# Patient Record
Sex: Female | Born: 1975 | Race: White | Hispanic: No | Marital: Single | Smoking: Never smoker
Health system: Southern US, Community
[De-identification: ages and names within clinical notes are randomized; demographics above are authoritative.]

## PROBLEM LIST (undated history)

## (undated) DIAGNOSIS — F99 Mental disorder, not otherwise specified: Secondary | ICD-10-CM

## (undated) DIAGNOSIS — Z309 Encounter for contraceptive management, unspecified: Secondary | ICD-10-CM

## (undated) DIAGNOSIS — D649 Anemia, unspecified: Secondary | ICD-10-CM

## (undated) DIAGNOSIS — I10 Essential (primary) hypertension: Secondary | ICD-10-CM

## (undated) DIAGNOSIS — E669 Obesity, unspecified: Secondary | ICD-10-CM

## (undated) HISTORY — PX: WISDOM TOOTH EXTRACTION: SHX21

## (undated) HISTORY — DX: Encounter for contraceptive management, unspecified: Z30.9

## (undated) HISTORY — PX: LASIK: SHX215

## (undated) HISTORY — DX: Obesity, unspecified: E66.9

## (undated) HISTORY — DX: Anemia, unspecified: D64.9

## (undated) HISTORY — PX: GUM SURGERY: SHX658

## (undated) HISTORY — DX: Mental disorder, not otherwise specified: F99

## (undated) HISTORY — DX: Essential (primary) hypertension: I10

## (undated) HISTORY — PX: KNEE ARTHROSCOPY: SHX127

---

## 1998-06-01 ENCOUNTER — Other Ambulatory Visit: Admission: RE | Admit: 1998-06-01 | Discharge: 1998-06-01 | Payer: Self-pay | Admitting: Obstetrics and Gynecology

## 1999-06-12 ENCOUNTER — Other Ambulatory Visit: Admission: RE | Admit: 1999-06-12 | Discharge: 1999-06-12 | Payer: Self-pay | Admitting: *Deleted

## 2000-07-03 ENCOUNTER — Other Ambulatory Visit: Admission: RE | Admit: 2000-07-03 | Discharge: 2000-07-03 | Payer: Self-pay | Admitting: *Deleted

## 2001-09-15 ENCOUNTER — Other Ambulatory Visit: Admission: RE | Admit: 2001-09-15 | Discharge: 2001-09-15 | Payer: Self-pay | Admitting: Obstetrics and Gynecology

## 2002-09-16 ENCOUNTER — Other Ambulatory Visit: Admission: RE | Admit: 2002-09-16 | Discharge: 2002-09-16 | Payer: Self-pay | Admitting: Obstetrics and Gynecology

## 2003-10-04 ENCOUNTER — Other Ambulatory Visit: Admission: RE | Admit: 2003-10-04 | Discharge: 2003-10-04 | Payer: Self-pay | Admitting: Obstetrics and Gynecology

## 2010-10-23 ENCOUNTER — Emergency Department (HOSPITAL_COMMUNITY)
Admission: EM | Admit: 2010-10-23 | Discharge: 2010-10-23 | Payer: Self-pay | Source: Home / Self Care | Admitting: Emergency Medicine

## 2010-10-24 LAB — CBC
HCT: 38.3 % (ref 36.0–46.0)
MCH: 28.2 pg (ref 26.0–34.0)
MCHC: 33.9 g/dL (ref 30.0–36.0)
MCV: 83.1 fL (ref 78.0–100.0)
Platelets: 328 10*3/uL (ref 150–400)
RDW: 14.2 % (ref 11.5–15.5)
WBC: 12.1 10*3/uL — ABNORMAL HIGH (ref 4.0–10.5)

## 2010-10-24 LAB — BASIC METABOLIC PANEL
BUN: 11 mg/dL (ref 6–23)
Calcium: 8.9 mg/dL (ref 8.4–10.5)
Creatinine, Ser: 0.8 mg/dL (ref 0.4–1.2)
GFR calc non Af Amer: >60 mL/min (ref >60)
Glucose, Bld: 107 mg/dL — ABNORMAL HIGH (ref 70–99)

## 2010-10-24 LAB — DIFFERENTIAL
Eosinophils Absolute: 0.1 10*3/uL (ref 0.0–0.7)
Eosinophils Relative: 1 % (ref 0–5)
Lymphocytes Relative: 13 % (ref 12–46)
Lymphs Abs: 1.6 10*3/uL (ref 0.7–4.0)
Monocytes Absolute: 0.9 10*3/uL (ref 0.1–1.0)

## 2011-06-11 ENCOUNTER — Other Ambulatory Visit: Payer: Self-pay | Admitting: Orthopedic Surgery

## 2011-06-11 DIAGNOSIS — M545 Low back pain: Secondary | ICD-10-CM

## 2011-06-15 ENCOUNTER — Ambulatory Visit
Admission: RE | Admit: 2011-06-15 | Discharge: 2011-06-15 | Disposition: A | Discharge status: Home / Self Care | Payer: BC Managed Care – PPO | Source: Ambulatory Visit | Attending: Orthopedic Surgery | Admitting: Orthopedic Surgery

## 2011-06-15 DIAGNOSIS — M545 Low back pain: Secondary | ICD-10-CM

## 2011-10-31 ENCOUNTER — Other Ambulatory Visit (HOSPITAL_COMMUNITY): Payer: Self-pay | Admitting: Internal Medicine

## 2011-10-31 ENCOUNTER — Ambulatory Visit (HOSPITAL_COMMUNITY)
Admission: RE | Admit: 2011-10-31 | Discharge: 2011-10-31 | Disposition: A | Discharge status: Home / Self Care | Payer: BC Managed Care – PPO | Source: Ambulatory Visit | Attending: Internal Medicine | Admitting: Internal Medicine

## 2011-10-31 DIAGNOSIS — M542 Cervicalgia: Secondary | ICD-10-CM

## 2013-09-28 ENCOUNTER — Other Ambulatory Visit: Payer: Self-pay | Admitting: Adult Health

## 2013-10-07 ENCOUNTER — Other Ambulatory Visit: Payer: Self-pay | Admitting: Adult Health

## 2013-10-22 ENCOUNTER — Other Ambulatory Visit: Payer: Self-pay | Admitting: *Deleted

## 2013-10-22 MED ORDER — LEVONORGESTREL-ETHINYL ESTRAD 0.1-20 MG-MCG PO TABS: 1.0000 | ORAL_TABLET | Freq: Every day | ORAL | Status: DC

## 2013-10-28 ENCOUNTER — Encounter (INDEPENDENT_AMBULATORY_CARE_PROVIDER_SITE_OTHER): Payer: Self-pay

## 2013-10-28 ENCOUNTER — Encounter: Payer: Self-pay | Admitting: Adult Health

## 2013-10-28 ENCOUNTER — Ambulatory Visit (INDEPENDENT_AMBULATORY_CARE_PROVIDER_SITE_OTHER): Payer: 59 | Admitting: Adult Health

## 2013-10-28 ENCOUNTER — Other Ambulatory Visit (HOSPITAL_COMMUNITY)
Admission: RE | Admit: 2013-10-28 | Discharge: 2013-10-28 | Disposition: A | Discharge status: Home / Self Care | Payer: 59 | Source: Ambulatory Visit | Attending: Adult Health | Admitting: Adult Health

## 2013-10-28 VITALS — BP 150/100 | HR 82 | Ht 61.0 in | Wt 198.0 lb

## 2013-10-28 DIAGNOSIS — Z01419 Encounter for gynecological examination (general) (routine) without abnormal findings: Secondary | ICD-10-CM

## 2013-10-28 DIAGNOSIS — I10 Essential (primary) hypertension: Secondary | ICD-10-CM | POA: Insufficient documentation

## 2013-10-28 DIAGNOSIS — Z309 Encounter for contraceptive management, unspecified: Secondary | ICD-10-CM

## 2013-10-28 DIAGNOSIS — Z1151 Encounter for screening for human papillomavirus (HPV): Secondary | ICD-10-CM | POA: Insufficient documentation

## 2013-10-28 HISTORY — DX: Encounter for contraceptive management, unspecified: Z30.9

## 2013-10-28 MED ORDER — LEVONORGESTREL-ETHINYL ESTRAD 0.1-20 MG-MCG PO TABS: 1.0000 | ORAL_TABLET | Freq: Every day | ORAL | Status: DC

## 2013-10-28 NOTE — Patient Instructions (Signed)
Physical in 1 year Mammogram at 40 Labs at PCP

## 2013-10-28 NOTE — Progress Notes (Signed)
Patient ID: Mckenzie Andrews, female   DOB: 18-Dec-1975, 38 y.o.   MRN: [id number] History of Present Illness: Mckenzie Andrews is a 38 year old white female, single in for a pap and physical.Happy with OCs,  Current Medications, Allergies, Past Medical History, Past Surgical History, Family History and Social History were reviewed in [facility] electronic medical record.   Past Medical History  Diagnosis Date  . Hypertension   . Obesity   . Mental disorder     anxiety  . Contraceptive management 10/28/2013   Past Surgical History  Procedure Laterality Date  . Knee arthroscopy    . Wisdom tooth extraction    Current outpatient prescriptions:hydrochlorothiazide (HYDRODIURIL) 25 MG tablet, Take 25 mg by mouth daily., Disp: , Rfl: ;  levonorgestrel-ethinyl estradiol (AVIANE) 0.1-20 MG-MCG tablet, Take 1 tablet by mouth daily., Disp: 1 Package, Rfl: 11;  NIFEdipine (PROCARDIA XL/ADALAT-CC) 60 MG 24 hr tablet, Take 60 mg by mouth daily., Disp: , Rfl: ;  sertraline (ZOLOFT) 50 MG tablet, Take 50 mg by mouth daily., Disp: , Rfl:   Review of Systems: Patient denies any blurred vision, shortness of breath, chest pain, abdominal pain, problems with bowel movements, urination, or intercourse, not currently having sex.No joint pain, takes zoloft for anxiety, has headaches but uses advil with relief.    Physical Exam:BP 150/100  Pulse 82  Ht 5\' 1"  (1.549 m)  Wt 198 lb (89.812 kg)  BMI 37.43 kg/m2  LMP 01/22/2015Has not taken BP meds today General:  Well developed, well nourished, no acute distress Skin:  Warm and dry, has tattoos Neck:  Midline trachea, normal thyroid Lungs; Clear to auscultation bilaterally Breast:  No dominant palpable mass, retraction, or nipple discharge Cardiovascular: Regular rate and rhythm Abdomen:  Soft, non tender, no hepatosplenomegaly Pelvic:  External genitalia is normal in appearance.  The vagina is normal in appearance. The cervix is smooth and friable with EC brush,  pap with HPV performed.  Uterus is felt to be normal size, shape, and contour.  No  adnexal masses or tenderness noted. Extremities:  No swelling or varicosities noted Psych:  Alert and cooperative, seems happy, works at [employer]   Impression: Yearly gyn exam  Contraceptive management Hypertension     Plan: Physical in 1 year Mammogram at 3940  Labs with PCP Refilled aviane x 1 year

## 2014-11-04 ENCOUNTER — Other Ambulatory Visit: Payer: Self-pay | Admitting: Adult Health

## 2015-10-09 ENCOUNTER — Other Ambulatory Visit: Payer: Self-pay | Admitting: Adult Health

## 2015-12-26 ENCOUNTER — Other Ambulatory Visit: Payer: Self-pay | Admitting: Adult Health

## 2016-01-04 ENCOUNTER — Encounter: Payer: Self-pay | Admitting: Adult Health

## 2016-01-04 ENCOUNTER — Ambulatory Visit (INDEPENDENT_AMBULATORY_CARE_PROVIDER_SITE_OTHER): Payer: BLUE CROSS/BLUE SHIELD | Admitting: Adult Health

## 2016-01-04 VITALS — BP 120/80 | HR 82 | Ht 61.5 in | Wt 192.0 lb

## 2016-01-04 DIAGNOSIS — Z3041 Encounter for surveillance of contraceptive pills: Secondary | ICD-10-CM

## 2016-01-04 DIAGNOSIS — Z01419 Encounter for gynecological examination (general) (routine) without abnormal findings: Secondary | ICD-10-CM | POA: Diagnosis not present

## 2016-01-04 MED ORDER — LEVONORGESTREL-ETHINYL ESTRAD 0.1-20 MG-MCG PO TABS: ORAL_TABLET | ORAL | Status: DC

## 2016-01-04 NOTE — Patient Instructions (Signed)
Pap and physical in 1 year Mammogram at 5440  Labs with PCP

## 2016-01-04 NOTE — Progress Notes (Signed)
Patient ID: Mckenzie Andrews, female   DOB: 1976/09/17, 40 y.o.   MRN: [id number] History of Present Illness: Mckenzie KaufmannMelissa is a 40 year old white female, G0P0, single in for a well woman gyn exam,she had a normal pap with negative HPV 10/28/13.No complaints.She is happy with her OCs, she has had labs with Dr Ouida SillsFagan recently and was anemic,she says. PCP is Dr Ouida SillsFagan.   Current Medications, Allergies, Past Medical History, Past Surgical History, Family History and Social History were reviewed in [facility] electronic medical record.     Review of Systems: Patient denies any headaches, hearing loss, fatigue, blurred vision, shortness of breath, chest pain, abdominal pain, problems with bowel movements, urination, or intercourse(not currently having sex). No joint pain or mood swings.    Physical Exam:BP 120/80 mmHg  Pulse 82  Ht 5' 1.5" (1.562 m)  Wt 192 lb (87.091 kg)  BMI 35.70 kg/m2  LMP 12/10/2015 General:  Well developed, well nourished, no acute distress Skin:  Warm and dry,has several tattoos Neck:  Midline trachea, normal thyroid, good ROM, no lymphadenopathy Lungs; Clear to auscultation bilaterally Breast:  No dominant palpable mass, retraction, or nipple discharge Cardiovascular: Regular rate and rhythm Abdomen:  Soft, non tender, no hepatosplenomegaly Pelvic:  External genitalia is normal in appearance, no lesions.  The vagina is normal in appearance. Urethra has no lesions or masses. The cervix is smooth.  Uterus is felt to be normal size, shape, and contour.  No adnexal masses or tenderness noted.Bladder is non tender, no masses felt. Extremities/musculoskeletal:  No swelling or varicosities noted, no clubbing or cyanosis Psych:  No mood changes, alert and cooperative,seems happy   Impression: Well woman gyn exam no pap Contraceptive management    Plan: Pap and physical in 1 year Mammogram at 40 Refilled Falmina x 1 year Labs with PCP

## 2016-05-24 ENCOUNTER — Other Ambulatory Visit: Payer: Self-pay | Admitting: Adult Health

## 2017-05-05 ENCOUNTER — Other Ambulatory Visit: Payer: Self-pay | Admitting: Adult Health

## 2019-02-25 ENCOUNTER — Other Ambulatory Visit (HOSPITAL_COMMUNITY): Payer: Self-pay | Admitting: Internal Medicine

## 2019-02-25 DIAGNOSIS — Z1231 Encounter for screening mammogram for malignant neoplasm of breast: Secondary | ICD-10-CM

## 2019-03-17 ENCOUNTER — Ambulatory Visit (HOSPITAL_COMMUNITY)
Admission: RE | Admit: 2019-03-17 | Discharge: 2019-03-17 | Disposition: A | Discharge status: Home / Self Care | Payer: BC Managed Care – PPO | Source: Ambulatory Visit | Attending: Internal Medicine | Admitting: Internal Medicine

## 2019-03-17 DIAGNOSIS — Z1231 Encounter for screening mammogram for malignant neoplasm of breast: Secondary | ICD-10-CM | POA: Insufficient documentation

## 2019-03-18 ENCOUNTER — Other Ambulatory Visit (HOSPITAL_COMMUNITY): Payer: Self-pay | Admitting: Internal Medicine

## 2019-03-18 DIAGNOSIS — R928 Other abnormal and inconclusive findings on diagnostic imaging of breast: Secondary | ICD-10-CM

## 2019-03-24 ENCOUNTER — Ambulatory Visit (HOSPITAL_COMMUNITY): Payer: Self-pay

## 2019-03-30 ENCOUNTER — Other Ambulatory Visit: Payer: Self-pay

## 2019-03-30 ENCOUNTER — Ambulatory Visit (HOSPITAL_COMMUNITY)
Admission: RE | Admit: 2019-03-30 | Discharge: 2019-03-30 | Disposition: A | Discharge status: Home / Self Care | Payer: BC Managed Care – PPO | Source: Ambulatory Visit | Attending: Internal Medicine | Admitting: Internal Medicine

## 2019-03-30 DIAGNOSIS — R928 Other abnormal and inconclusive findings on diagnostic imaging of breast: Secondary | ICD-10-CM

## 2020-02-02 ENCOUNTER — Encounter: Payer: Self-pay | Admitting: Adult Health

## 2020-02-02 ENCOUNTER — Ambulatory Visit (INDEPENDENT_AMBULATORY_CARE_PROVIDER_SITE_OTHER): Payer: 59 | Admitting: Adult Health

## 2020-02-02 ENCOUNTER — Other Ambulatory Visit: Payer: Self-pay

## 2020-02-02 ENCOUNTER — Other Ambulatory Visit (HOSPITAL_COMMUNITY)
Admission: RE | Admit: 2020-02-02 | Discharge: 2020-02-02 | Disposition: A | Discharge status: Home / Self Care | Payer: 59 | Source: Ambulatory Visit | Attending: Adult Health | Admitting: Adult Health

## 2020-02-02 VITALS — BP 116/77 | HR 80 | Ht 61.5 in | Wt 185.0 lb

## 2020-02-02 DIAGNOSIS — Z1212 Encounter for screening for malignant neoplasm of rectum: Secondary | ICD-10-CM | POA: Diagnosis not present

## 2020-02-02 DIAGNOSIS — Z01419 Encounter for gynecological examination (general) (routine) without abnormal findings: Secondary | ICD-10-CM | POA: Diagnosis present

## 2020-02-02 DIAGNOSIS — Z1272 Encounter for screening for malignant neoplasm of vagina: Secondary | ICD-10-CM

## 2020-02-02 DIAGNOSIS — N926 Irregular menstruation, unspecified: Secondary | ICD-10-CM | POA: Diagnosis not present

## 2020-02-02 DIAGNOSIS — Z7689 Persons encountering health services in other specified circumstances: Secondary | ICD-10-CM

## 2020-02-02 DIAGNOSIS — I10 Essential (primary) hypertension: Secondary | ICD-10-CM

## 2020-02-02 DIAGNOSIS — Z1211 Encounter for screening for malignant neoplasm of colon: Secondary | ICD-10-CM

## 2020-02-02 LAB — HEMOCCULT GUIAC POC 1CARD (OFFICE): Fecal Occult Blood, POC: NEGATIVE

## 2020-02-02 MED ORDER — NORETHINDRONE 0.35 MG PO TABS: 1.0000 | ORAL_TABLET | Freq: Every day | ORAL | 11 refills | Status: DC

## 2020-02-02 NOTE — Addendum Note (Signed)
Addended by: Lynnell Dike on: 02/02/2020 02:15 PM   Modules accepted: Orders

## 2020-02-02 NOTE — Progress Notes (Signed)
Patient ID: Mckenzie Andrews, female   DOB: 1975/11/13, 44 y.o.   MRN: [id number] History of Present Illness:  Mckenzie Andrews is a 44 year old white female,single, G0P0, in for a well woman gyn exam and pap. Has been off OCs and periods irregular and gets stomach pain and headaches with period and body aches. PCP is Dr Ouida Sills.   Current Medications, Allergies, Past Medical History, Past Surgical History, Family History and Social History were reviewed in [employer] [facility] record.     Review of Systems: Patient denies any  hearing loss, fatigue, blurred vision, shortness of breath, chest pain, abdominal pain, problems with bowel movements, urination, or intercourse(not active). No joint pain or mood swings. Periods irregular and heavy 1-2 days, stomach hurts and has headaches then too, along with body aches.    Physical Exam:BP 116/77 (BP Location: Right Arm, Patient Position: Sitting, Cuff Size: Normal)   Pulse 80   Ht 5' 1.5" (1.562 m)   Wt 185 lb (83.9 kg)   LMP 01/28/2020 (Exact Date)   BMI 34.39 kg/m  General:  Well developed, well nourished, no acute distress Skin:  Warm and dry Neck:  Midline trachea, normal thyroid, good ROM, no lymphadenopathy Lungs; Clear to auscultation bilaterally Breast:  No dominant palpable mass, retraction, or nipple discharge Cardiovascular: Regular rate and rhythm Abdomen:  Soft, non tender, no hepatosplenomegaly Pelvic:  External genitalia is normal in appearance, no lesions.  The vagina is normal in appearance. Urethra has no lesions or masses. The cervix is smooth and nulliparous, pap with high risk HPV 16/18 genotyping performed.  Uterus is felt to be normal size, shape, and contour.  No adnexal masses or tenderness noted.Bladder is non tender, no masses felt. Rectal: Good sphincter tone, no polyps, or hemorrhoids felt.  Hemoccult negative. Extremities/musculoskeletal:  No swelling or varicosities noted, no clubbing or cyanosis Psych:  No  mood changes, alert and cooperative,seems happy AA 7  Fall risk is low PHQ 9 score is 2, no SI is on Zoloft Examination chaperoned by Stoney Bang LPN   Impression: 1. Encounter for gynecological examination with Papanicolaou smear of cervix Pap sent Physical in 1 year Pap in 3 if normal Mammogram yearly Check CBC,CMP,TSH and lipids  2. Screening for colorectal cancer Colonoscopy at 50  3. Menstrual periods irregular Will try Micronor   4. Encounter for menstrual regulation Will Rx Micronor, start with next period Meds ordered this encounter  Medications  . norethindrone (MICRONOR) 0.35 MG tablet    Sig: Take 1 tablet (0.35 mg total) by mouth daily.    Dispense:  1 Package    Refill:  11    Order Specific Question:   Supervising Provider    Answer:   Despina Hidden, LUTHER H [[id number]]    5. Hypertension, unspecified type On meds from PCP

## 2020-02-03 ENCOUNTER — Telehealth: Payer: Self-pay | Admitting: Adult Health

## 2020-02-03 LAB — COMPREHENSIVE METABOLIC PANEL
ALT: 20 IU/L (ref 0–32)
AST: 23 IU/L (ref 0–40)
Albumin/Globulin Ratio: 1.3 (ref 1.2–2.2)
Albumin: 4.1 g/dL (ref 3.8–4.8)
Alkaline Phosphatase: 86 IU/L (ref 39–117)
BUN/Creatinine Ratio: 13 (ref 9–23)
BUN: 9 mg/dL (ref 6–24)
Bilirubin Total: 0.3 mg/dL (ref 0.0–1.2)
CO2: 24 mmol/L (ref 20–29)
Calcium: 8.9 mg/dL (ref 8.7–10.2)
Chloride: 100 mmol/L (ref 96–106)
Creatinine, Ser: 0.72 mg/dL (ref 0.57–1.00)
GFR calc Af Amer: 119 mL/min/{1.73_m2} (ref >59)
GFR calc non Af Amer: 103 mL/min/{1.73_m2} (ref >59)
Globulin, Total: 3.2 g/dL (ref 1.5–4.5)
Glucose: 89 mg/dL (ref 65–99)
Potassium: 4.1 mmol/L (ref 3.5–5.2)
Sodium: 138 mmol/L (ref 134–144)
Total Protein: 7.3 g/dL (ref 6.0–8.5)

## 2020-02-03 LAB — CBC
Hematocrit: 33 % — ABNORMAL LOW (ref 34.0–46.6)
Hemoglobin: 10 g/dL — ABNORMAL LOW (ref 11.1–15.9)
MCH: 21.6 pg — ABNORMAL LOW (ref 26.6–33.0)
MCHC: 30.3 g/dL — ABNORMAL LOW (ref 31.5–35.7)
MCV: 71 fL — ABNORMAL LOW (ref 79–97)
Platelets: 397 10*3/uL (ref 150–450)
RBC: 4.62 x10E6/uL (ref 3.77–5.28)
RDW: 16.1 % — ABNORMAL HIGH (ref 11.7–15.4)
WBC: 7 10*3/uL (ref 3.4–10.8)

## 2020-02-03 LAB — LIPID PANEL
Chol/HDL Ratio: 4 ratio (ref 0.0–4.4)
Cholesterol, Total: 195 mg/dL (ref 100–199)
HDL: 49 mg/dL (ref >39)
LDL Chol Calc (NIH): 114 mg/dL — ABNORMAL HIGH (ref 0–99)
Triglycerides: 185 mg/dL — ABNORMAL HIGH (ref 0–149)
VLDL Cholesterol Cal: 32 mg/dL (ref 5–40)

## 2020-02-03 LAB — TSH: TSH: 0.994 u[IU]/mL (ref 0.450–4.500)

## 2020-02-03 NOTE — Telephone Encounter (Signed)
Left message that HGB is 10 so anemic, take OTC iron, get Flintstones complete 2 daily will be good, triglycerides and LDL slightly elevated, but other labs look good, and start Micronor. Will recheck hemoglobin in 3 months

## 2020-02-04 ENCOUNTER — Other Ambulatory Visit: Payer: BC Managed Care – PPO | Admitting: Adult Health

## 2020-02-08 LAB — CYTOLOGY - PAP
Comment: NEGATIVE
Diagnosis: NEGATIVE
High risk HPV: NEGATIVE

## 2020-03-01 ENCOUNTER — Other Ambulatory Visit: Payer: BC Managed Care – PPO | Admitting: Adult Health

## 2020-03-13 ENCOUNTER — Other Ambulatory Visit (HOSPITAL_COMMUNITY): Payer: Self-pay | Admitting: Internal Medicine

## 2020-03-13 ENCOUNTER — Telehealth: Payer: Self-pay | Admitting: Adult Health

## 2020-03-13 DIAGNOSIS — Z1231 Encounter for screening mammogram for malignant neoplasm of breast: Secondary | ICD-10-CM

## 2020-03-13 MED ORDER — NORETHINDRONE ACET-ETHINYL EST 1-20 MG-MCG PO TABS: 1.0000 | ORAL_TABLET | Freq: Every day | ORAL | 11 refills | Status: DC

## 2020-03-13 NOTE — Telephone Encounter (Signed)
Stephnie called is bleeding on Micronor, will change to junel 1/20

## 2020-04-12 ENCOUNTER — Other Ambulatory Visit: Payer: Self-pay

## 2020-04-12 ENCOUNTER — Ambulatory Visit (HOSPITAL_COMMUNITY)
Admission: RE | Admit: 2020-04-12 | Discharge: 2020-04-12 | Disposition: A | Discharge status: Home / Self Care | Payer: 59 | Source: Ambulatory Visit | Attending: Internal Medicine | Admitting: Internal Medicine

## 2020-04-12 DIAGNOSIS — Z1231 Encounter for screening mammogram for malignant neoplasm of breast: Secondary | ICD-10-CM | POA: Diagnosis not present

## 2020-05-04 ENCOUNTER — Ambulatory Visit: Payer: 59 | Admitting: Adult Health

## 2021-03-23 ENCOUNTER — Other Ambulatory Visit: Payer: Self-pay | Admitting: Adult Health

## 2021-12-07 ENCOUNTER — Other Ambulatory Visit (HOSPITAL_COMMUNITY): Payer: Self-pay | Admitting: Internal Medicine

## 2021-12-07 DIAGNOSIS — Z1231 Encounter for screening mammogram for malignant neoplasm of breast: Secondary | ICD-10-CM

## 2021-12-24 ENCOUNTER — Ambulatory Visit (HOSPITAL_COMMUNITY)
Admission: RE | Admit: 2021-12-24 | Discharge: 2021-12-24 | Disposition: A | Discharge status: Home / Self Care | Payer: 59 | Source: Ambulatory Visit | Attending: Internal Medicine | Admitting: Internal Medicine

## 2021-12-24 ENCOUNTER — Other Ambulatory Visit: Payer: Self-pay

## 2021-12-24 DIAGNOSIS — Z1231 Encounter for screening mammogram for malignant neoplasm of breast: Secondary | ICD-10-CM | POA: Diagnosis not present

## 2022-03-04 ENCOUNTER — Other Ambulatory Visit: Payer: Self-pay | Admitting: Adult Health

## 2022-04-05 ENCOUNTER — Ambulatory Visit: Payer: 59 | Admitting: Adult Health

## 2022-05-08 ENCOUNTER — Ambulatory Visit (INDEPENDENT_AMBULATORY_CARE_PROVIDER_SITE_OTHER): Payer: 59 | Admitting: Adult Health

## 2022-05-08 ENCOUNTER — Encounter: Payer: Self-pay | Admitting: Adult Health

## 2022-05-08 VITALS — BP 91/59 | HR 103 | Ht 61.5 in | Wt 188.0 lb

## 2022-05-08 DIAGNOSIS — R195 Other fecal abnormalities: Secondary | ICD-10-CM | POA: Diagnosis not present

## 2022-05-08 DIAGNOSIS — Z13220 Encounter for screening for lipoid disorders: Secondary | ICD-10-CM

## 2022-05-08 DIAGNOSIS — Z1211 Encounter for screening for malignant neoplasm of colon: Secondary | ICD-10-CM | POA: Diagnosis not present

## 2022-05-08 DIAGNOSIS — Z3041 Encounter for surveillance of contraceptive pills: Secondary | ICD-10-CM | POA: Diagnosis not present

## 2022-05-08 DIAGNOSIS — Z01419 Encounter for gynecological examination (general) (routine) without abnormal findings: Secondary | ICD-10-CM | POA: Diagnosis not present

## 2022-05-08 DIAGNOSIS — Z862 Personal history of diseases of the blood and blood-forming organs and certain disorders involving the immune mechanism: Secondary | ICD-10-CM | POA: Diagnosis not present

## 2022-05-08 LAB — HEMOCCULT GUIAC POC 1CARD (OFFICE): Fecal Occult Blood, POC: POSITIVE — AB

## 2022-05-08 MED ORDER — LEVONORGESTREL-ETHINYL ESTRAD 0.1-20 MG-MCG PO TABS: ORAL_TABLET | ORAL | 12 refills | Status: DC

## 2022-05-08 NOTE — Progress Notes (Signed)
Patient ID: Mckenzie Andrews, female   DOB: 1976/02/05, 46 y.o.   MRN: [id number] History of Present Illness: Mckenzie Andrews is a 46 year old white female,single, G0P0, in for a well woman gyn exam.  Lab Results  Component Value Date   DIAGPAP  02/02/2020    - Negative for intraepithelial lesion or malignancy (NILM)   HPVHIGH Negative 02/02/2020   PCP is Dr Ouida Sills.  Current Medications, Allergies, Past Medical History, Past Surgical History, Family History and Social History were reviewed in [employer] [facility] record.     Review of Systems: Patient denies any headaches, hearing loss, fatigue, blurred vision, shortness of breath, chest pain, abdominal pain, problems with bowel movements, urination, or intercourse(not active). No joint pain or mood swings.     Physical Exam:BP (!) 91/59 (BP Location: Right Arm, Patient Position: Sitting, Cuff Size: Normal)   Pulse (!) 103   Ht 5' 1.5" (1.562 m)   Wt 188 lb (85.3 kg)   LMP 04/05/2022   BMI 34.95 kg/m   General:  Well developed, well nourished, no acute distress Skin:  Warm and dry Neck:  Midline trachea, normal thyroid, good ROM, no lymphadenopathy Lungs; Clear to auscultation bilaterally Breast:  No dominant palpable mass, retraction, or nipple discharge Cardiovascular: Regular rate and rhythm Abdomen:  Soft, non tender, no hepatosplenomegaly Pelvic:  External genitalia is normal in appearance, has several angiokeratomas left labia.  The vagina is normal in appearance. Urethra has no lesions or masses. The cervix is nulliparous.   Uterus is felt to be normal size, shape, and contour.  No adnexal masses or tenderness noted.Bladder is non tender, no masses felt. Rectal: Good sphincter tone, no polyps, or hemorrhoids felt.  Hemoccult positive Extremities/musculoskeletal:  No swelling or varicosities noted, no clubbing or cyanosis Psych:  No mood changes, alert and cooperative,seems happy AA is 9 Fall risk is low    05/08/2022     2:27 PM 02/02/2020    1:40 PM  Depression screen PHQ 2/9  Decreased Interest 1 0  Down, Depressed, Hopeless 0 0  PHQ - 2 Score 1 0  Altered sleeping 0 1  Tired, decreased energy 1 1  Change in appetite 0 0  Feeling bad or failure about yourself  0 0  Trouble concentrating 0 0  Moving slowly or fidgety/restless 0 0  Suicidal thoughts 0 0  PHQ-9 Score 2 2  Difficult doing work/chores  Not difficult at all       05/08/2022    2:27 PM 02/02/2020    1:40 PM  GAD 7 : Generalized Anxiety Score  Nervous, Anxious, on Edge 2 0  Control/stop worrying 0 0  Worry too much - different things 0 0  Trouble relaxing 1 0  Restless 0 0  Easily annoyed or irritable 1 1  Afraid - awful might happen 0 0  Total GAD 7 Score 4 1  Anxiety Difficulty  Not difficult at all    Upstream - 05/08/22 1428       Pregnancy Intention Screening   Does the patient want to become pregnant in the next year? No    Does the patient's partner want to become pregnant in the next year? No    Would the patient like to discuss contraceptive options today? No      Contraception Wrap Up   Current Method Oral Contraceptive    End Method Oral Contraceptive    Contraception Counseling Provided No  Examination chaperoned by Faith Rogue LPN    Impression and Plan: 1. Encounter for well woman exam with routine gynecological exam Physical in 1 year Check labs Had normal mammogram 12/24/21. - CBC - Comprehensive metabolic panel - TSH - Lipid panel  2. Encounter for screening fecal occult blood testing +hemoccult  - POCT occult blood stool  3. Encounter for surveillance of contraceptive pills Will continue OCs Meds ordered this encounter  Medications   levonorgestrel-ethinyl estradiol (FALMINA) 0.1-20 MG-MCG tablet    Sig: TAKE ONE (1) TABLET BY MOUTH EVERY DAY    Dispense:  28 tablet    Refill:  12    Tell [name] to make appt to be seen, please    Order Specific Question:   Supervising  Provider    Answer:   Despina Hidden, LUTHER H [[id number]]     4. Fecal occult blood test positive +hemoccult, will send 3 cards home with her to do, if any + refer to GI for colonoscopy  - POCT occult blood stool  5. History of anemia - CBC  6. Screening cholesterol level Check fasting  - Lipid panel

## 2022-05-24 ENCOUNTER — Other Ambulatory Visit (INDEPENDENT_AMBULATORY_CARE_PROVIDER_SITE_OTHER): Payer: 59

## 2022-05-24 ENCOUNTER — Telehealth: Payer: Self-pay | Admitting: *Deleted

## 2022-05-24 DIAGNOSIS — Z1212 Encounter for screening for malignant neoplasm of rectum: Secondary | ICD-10-CM

## 2022-05-24 DIAGNOSIS — Z1211 Encounter for screening for malignant neoplasm of colon: Secondary | ICD-10-CM

## 2022-05-24 LAB — HEMOCCULT GUIAC POC 1CARD (OFFICE)
Card #2 Fecal Occult Blod, POC: NEGATIVE
Card #3 Fecal Occult Blood, POC: NEGATIVE
Fecal Occult Blood, POC: NEGATIVE

## 2022-05-24 NOTE — Telephone Encounter (Signed)
Pt aware hemocult cards were negative. Pt voiced understanding. JSY 

## 2022-06-19 IMAGING — MG MM DIGITAL SCREENING BILAT W/ TOMO AND CAD
6 of 10 series · 6 of 30 positions shown · non-contrast
Comparison: Previous exam(s).

CLINICAL DATA: Screening.

EXAM:
DIGITAL SCREENING BILATERAL MAMMOGRAM WITH TOMOSYNTHESIS AND CAD
TECHNIQUE: Bilateral screening digital craniocaudal and mediolateral oblique
mammograms were obtained. Bilateral screening digital breast
tomosynthesis was performed. The images were evaluated with
computer-aided detection.

[L CC synth-2D]
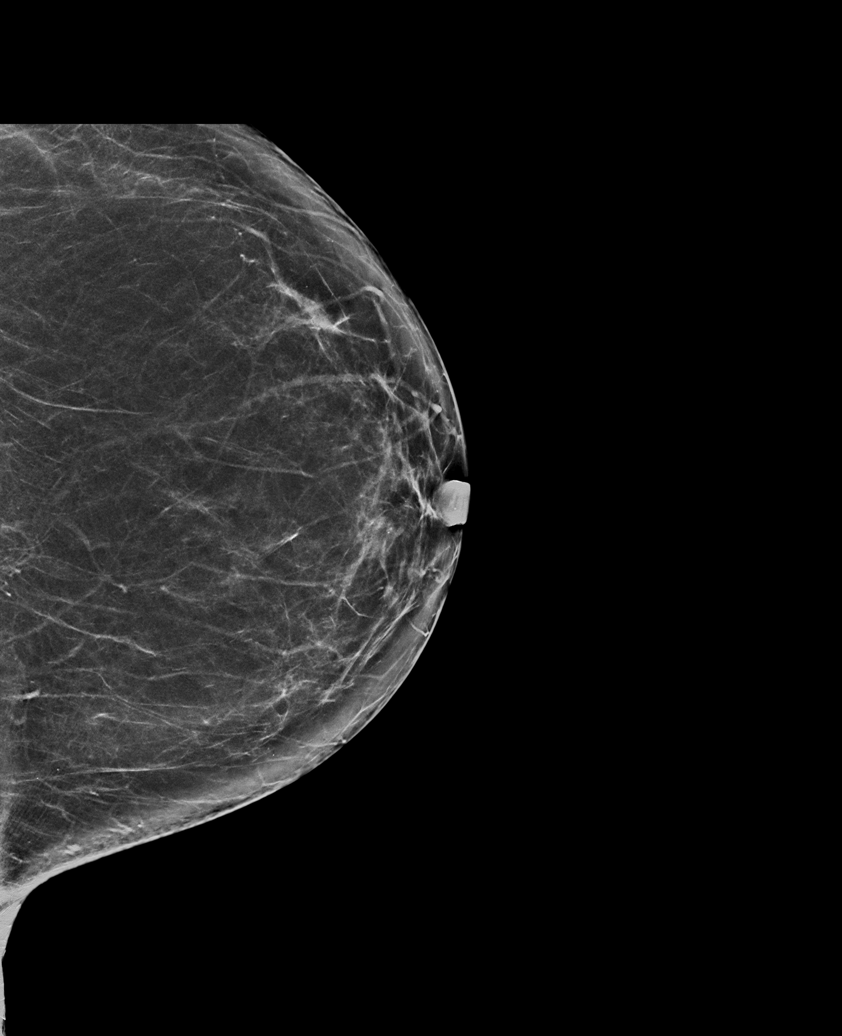

[L XCCL synth-2D]
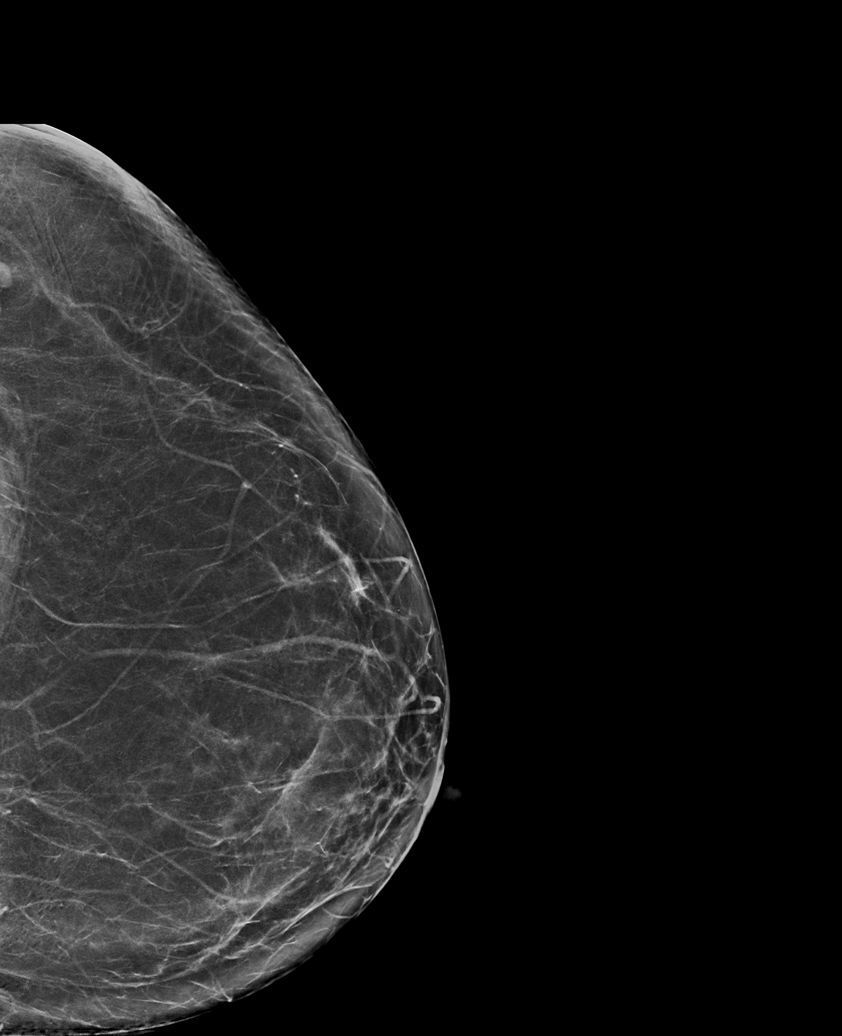

[R MLO synth-2D]
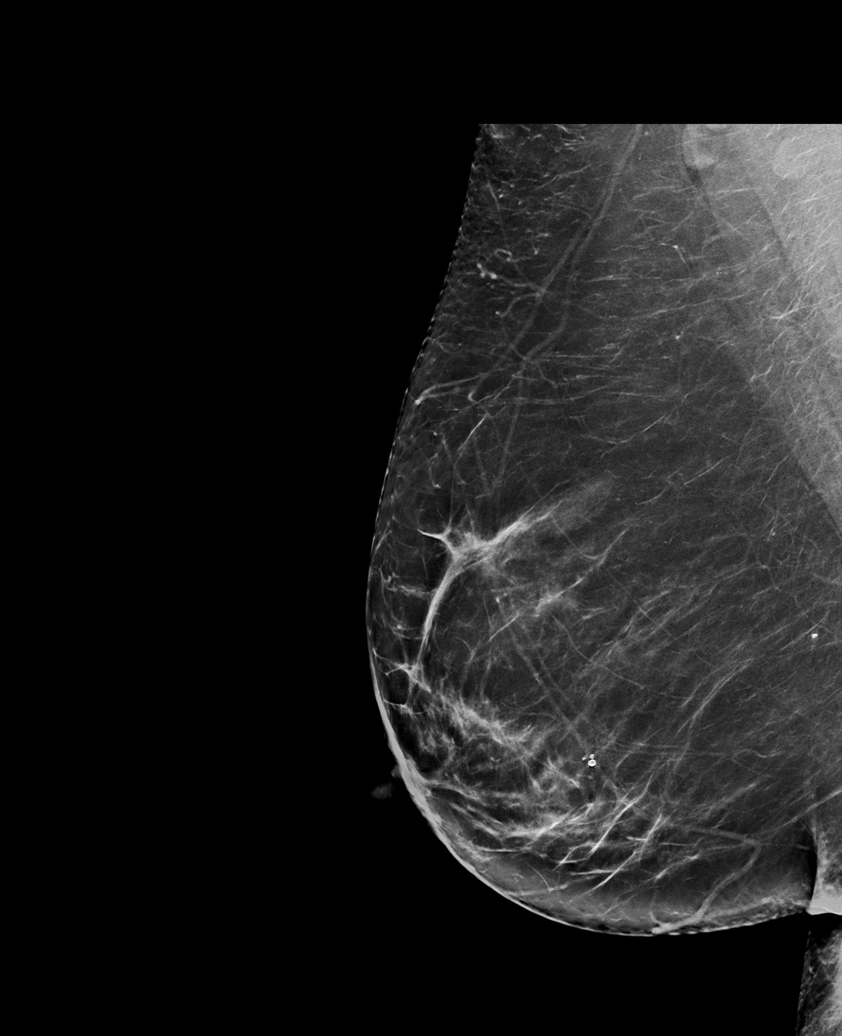

[L MLO synth-2D]
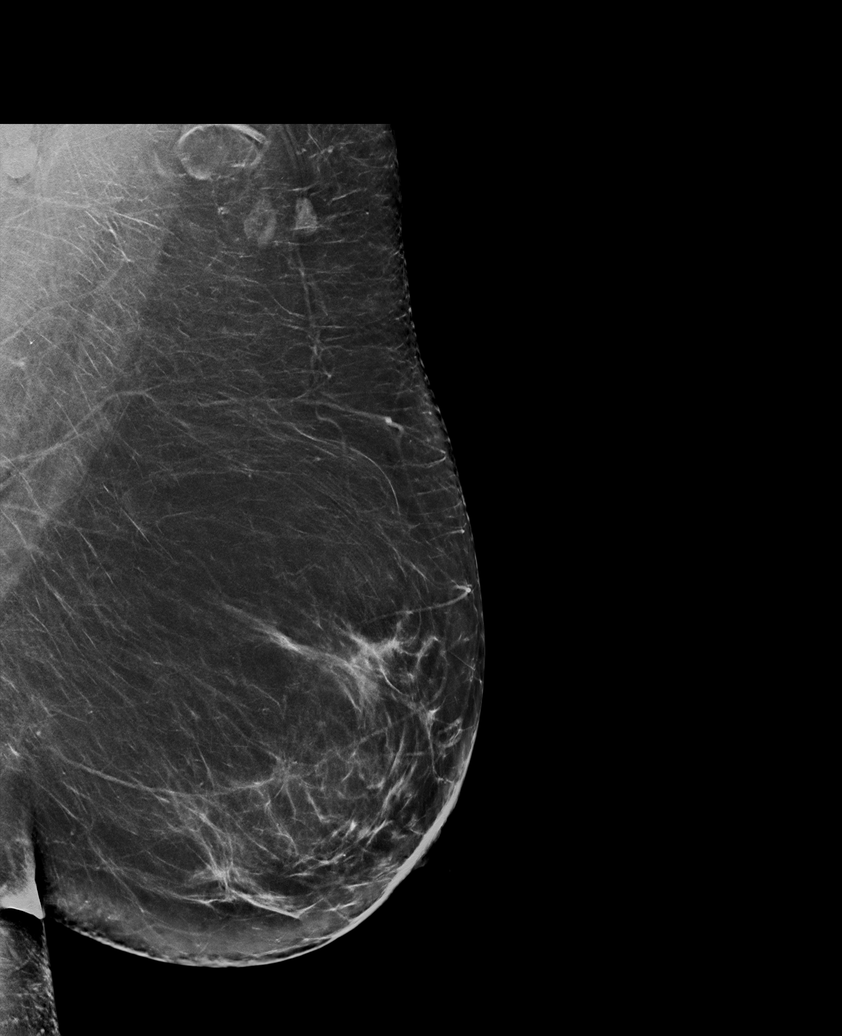

[R CC synth-2D]
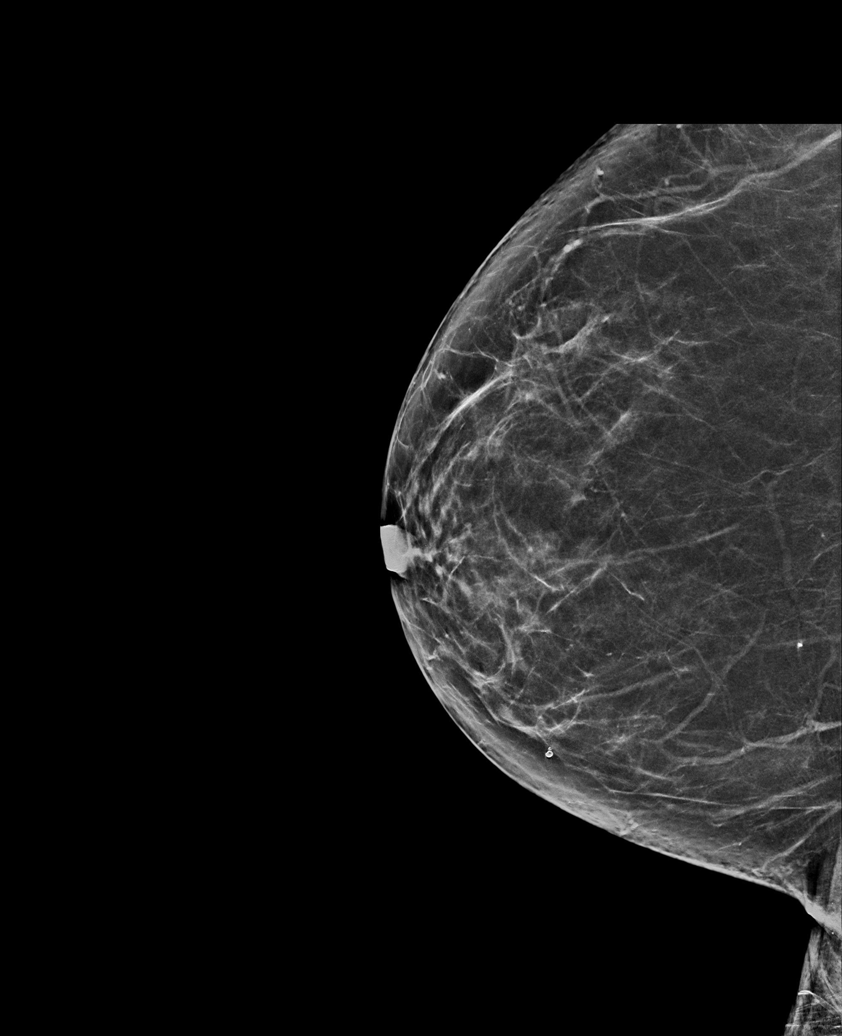

[L MLO tomo · tomo slice 45/89.0]
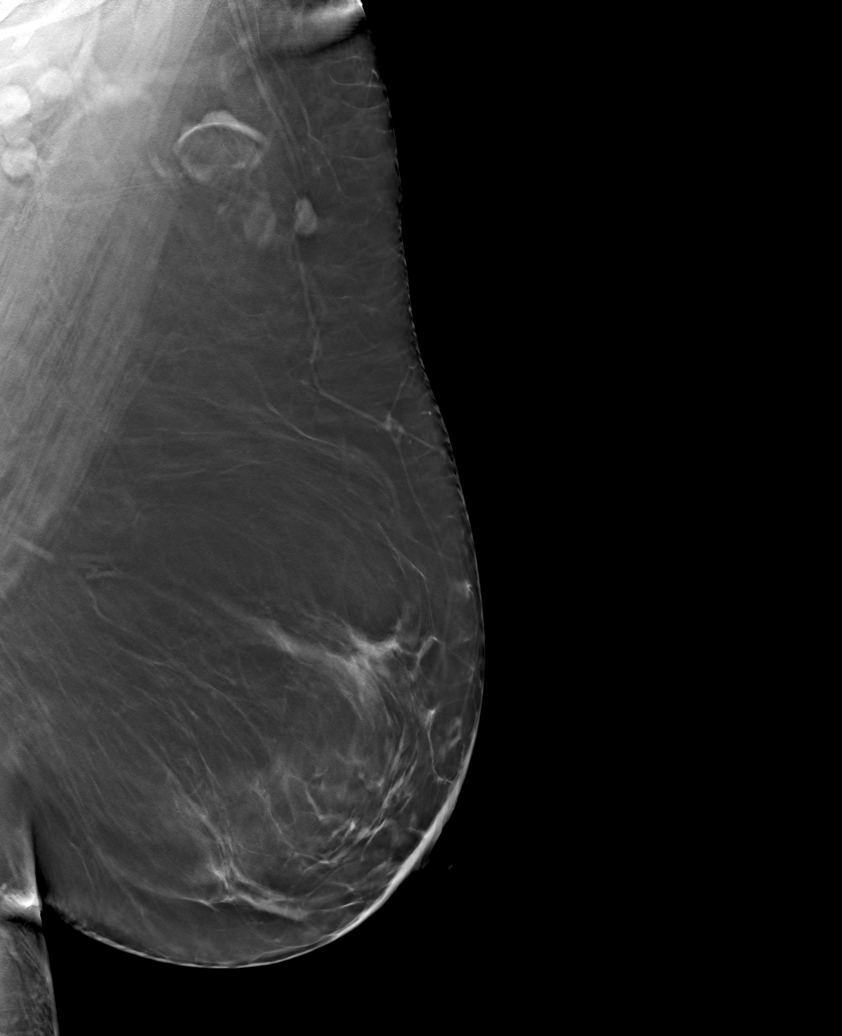

[6 of 30 positions shown; findings below may reference images not displayed]

ACR Breast Density Category b: There are scattered areas of
fibroglandular density.
FINDINGS: There are no findings suspicious for malignancy.
IMPRESSION: No mammographic evidence of malignancy. A result letter of this
screening mammogram will be mailed directly to the patient.

RECOMMENDATION:
Screening mammogram in one year. (Code:51-O-LD2)

BI-RADS CATEGORY  1: Negative.

## 2023-02-13 ENCOUNTER — Other Ambulatory Visit (HOSPITAL_COMMUNITY): Payer: Self-pay | Admitting: Adult Health

## 2023-02-13 DIAGNOSIS — Z1231 Encounter for screening mammogram for malignant neoplasm of breast: Secondary | ICD-10-CM

## 2023-02-17 ENCOUNTER — Ambulatory Visit (HOSPITAL_COMMUNITY): Payer: 59

## 2023-02-26 ENCOUNTER — Other Ambulatory Visit (HOSPITAL_COMMUNITY): Payer: Self-pay | Admitting: Adult Health

## 2023-02-26 DIAGNOSIS — Z1231 Encounter for screening mammogram for malignant neoplasm of breast: Secondary | ICD-10-CM

## 2023-03-03 ENCOUNTER — Ambulatory Visit (HOSPITAL_COMMUNITY)
Admission: RE | Admit: 2023-03-03 | Discharge: 2023-03-03 | Disposition: A | Discharge status: Home / Self Care | Payer: 59 | Source: Ambulatory Visit | Attending: Adult Health | Admitting: Adult Health

## 2023-03-03 DIAGNOSIS — Z1231 Encounter for screening mammogram for malignant neoplasm of breast: Secondary | ICD-10-CM | POA: Insufficient documentation

## 2023-05-15 DIAGNOSIS — L814 Other melanin hyperpigmentation: Secondary | ICD-10-CM | POA: Diagnosis not present

## 2023-05-15 DIAGNOSIS — L568 Other specified acute skin changes due to ultraviolet radiation: Secondary | ICD-10-CM | POA: Diagnosis not present

## 2023-05-15 DIAGNOSIS — X32XXXA Exposure to sunlight, initial encounter: Secondary | ICD-10-CM | POA: Diagnosis not present

## 2023-05-15 DIAGNOSIS — D485 Neoplasm of uncertain behavior of skin: Secondary | ICD-10-CM | POA: Diagnosis not present

## 2023-05-15 DIAGNOSIS — L249 Irritant contact dermatitis, unspecified cause: Secondary | ICD-10-CM | POA: Diagnosis not present

## 2023-07-02 ENCOUNTER — Ambulatory Visit (INDEPENDENT_AMBULATORY_CARE_PROVIDER_SITE_OTHER): Payer: 59 | Admitting: Adult Health

## 2023-07-02 ENCOUNTER — Other Ambulatory Visit (HOSPITAL_COMMUNITY)
Admission: RE | Admit: 2023-07-02 | Discharge: 2023-07-02 | Disposition: A | Discharge status: Home / Self Care | Payer: 59 | Source: Ambulatory Visit | Attending: Adult Health | Admitting: Adult Health

## 2023-07-02 ENCOUNTER — Encounter: Payer: Self-pay | Admitting: Adult Health

## 2023-07-02 VITALS — BP 100/75 | HR 76 | Ht 61.0 in | Wt 194.0 lb

## 2023-07-02 DIAGNOSIS — N926 Irregular menstruation, unspecified: Secondary | ICD-10-CM | POA: Diagnosis not present

## 2023-07-02 DIAGNOSIS — Z01419 Encounter for gynecological examination (general) (routine) without abnormal findings: Secondary | ICD-10-CM

## 2023-07-02 DIAGNOSIS — R195 Other fecal abnormalities: Secondary | ICD-10-CM | POA: Diagnosis not present

## 2023-07-02 DIAGNOSIS — Z13220 Encounter for screening for lipoid disorders: Secondary | ICD-10-CM | POA: Diagnosis not present

## 2023-07-02 DIAGNOSIS — Z1211 Encounter for screening for malignant neoplasm of colon: Secondary | ICD-10-CM

## 2023-07-02 DIAGNOSIS — Z1330 Encounter for screening examination for mental health and behavioral disorders, unspecified: Secondary | ICD-10-CM

## 2023-07-02 DIAGNOSIS — Z1212 Encounter for screening for malignant neoplasm of rectum: Secondary | ICD-10-CM | POA: Diagnosis not present

## 2023-07-02 LAB — HEMOCCULT GUIAC POC 1CARD (OFFICE): Fecal Occult Blood, POC: POSITIVE — AB

## 2023-07-02 NOTE — Progress Notes (Signed)
Patient ID: Mckenzie Andrews, female   DOB: [birth year], 47 y.o.   MRN: [id number] History of Present Illness: Mckenzie Andrews is a 47 year old white female,single, G0P0, in for a well woman gyn exam and pap. She stopped BCP in May and has not had a period or bleeding since.  She got flu shot last week and Tdap last year.  PCP is Dr Ouida Sills   Current Medications, Allergies, Past Medical History, Past Surgical History, Family History and Social History were reviewed in [employer] electronic medical record.     Review of Systems: Patient denies any headaches, hearing loss, fatigue, blurred vision, shortness of breath, chest pain, abdominal pain, problems with bowel movements, urination, or intercourse.(She is not active). No joint pain or mood swings.  No period or bleeding since stopping BCP in May Does not sleep well but has 2 dogs in bed with her   Physical Exam:BP 100/75 (BP Location: Left Arm, Patient Position: Sitting, Cuff Size: Large)   Pulse 76   Ht 5\' 1"  (1.549 m)   Wt 194 lb (88 kg)   LMP  (LMP Unknown) Comment: No period since stopped OCP in May 2024  BMI 36.66 kg/m   General:  Well developed, well nourished, no acute distress Skin:  Warm and dry Neck:  Midline trachea, normal thyroid, good ROM, no lymphadenopathy Lungs; Clear to auscultation bilaterally Breast:  No dominant palpable mass, retraction, or nipple discharge Cardiovascular: Regular rate and rhythm Abdomen:  Soft, non tender, no hepatosplenomegaly Pelvic:  External genitalia is normal in appearance, no lesions.  The vagina is normal in appearance. Urethra has no lesions or masses. The cervix is smooth,pap with HR HPV genotyping performed.  Uterus is felt to be normal size, shape, and contour.  No adnexal masses or tenderness noted.Bladder is non tender, no masses felt. Rectal: Good sphincter tone, no polyps, or hemorrhoids felt.  Hemoccult positive Extremities/musculoskeletal:  No swelling or varicosities noted, no  clubbing or cyanosis Psych:  No mood changes, alert and cooperative,seems happy AA is 7 Fall risk is low    07/02/2023    3:43 PM 05/08/2022    2:27 PM 02/02/2020    1:40 PM  Depression screen PHQ 2/9  Decreased Interest 1 1 0  Down, Depressed, Hopeless 0 0 0  PHQ - 2 Score 1 1 0  Altered sleeping 1 0 1  Tired, decreased energy 1 1 1   Change in appetite 0 0 0  Feeling bad or failure about yourself  0 0 0  Trouble concentrating 0 0 0  Moving slowly or fidgety/restless 0 0 0  Suicidal thoughts 0 0 0  PHQ-9 Score 3 2 2   Difficult doing work/chores   Not difficult at all       07/02/2023    3:44 PM 05/08/2022    2:27 PM 02/02/2020    1:40 PM  GAD 7 : Generalized Anxiety Score  Nervous, Anxious, on Edge 1 2 0  Control/stop worrying 0 0 0  Worry too much - different things 0 0 0  Trouble relaxing 0 1 0  Restless 0 0 0  Easily annoyed or irritable 1 1 1   Afraid - awful might happen 0 0 0  Total GAD 7 Score 2 4 1   Anxiety Difficulty   Not difficult at all      Upstream - 07/02/23 1551       Pregnancy Intention Screening   Does the patient want to become pregnant in the next year? No  Would the patient like to discuss contraceptive options today? No      Contraception Wrap Up   Current Method Abstinence    End Method Abstinence    Contraception Counseling Provided No            Examination chaperoned by Freddie Apley RN   Impression and Plan: 1. Encounter for gynecological examination with Papanicolaou smear of cervix Pap sent Pap in 3 years if normal Physical in 1 year Check fasting labs  - CBC - Comprehensive metabolic panel - Lipid panel - Cytology - PAP( [facility]) Mammogram was negative 03/03/23  2. Encounter for screening fecal occult blood testing Hemoccult was + 3 cards sent home to do - POCT occult blood stool  3. Fecal occult blood test positive Hemoccult was + Sent home 3 hemoccult cards to do and bring back - POCT occult blood stool  4.  Screening cholesterol level - Lipid panel  5. Missed periods - TSH Will check FSH to assess for menopause - Follicle stimulating hormone  6. Screening for colorectal cancer Will order cologuard  - Cologuard

## 2023-07-09 ENCOUNTER — Other Ambulatory Visit: Payer: Self-pay | Admitting: Adult Health

## 2023-07-09 LAB — CYTOLOGY - PAP
Comment: NEGATIVE
Diagnosis: NEGATIVE
Diagnosis: REACTIVE
High risk HPV: NEGATIVE

## 2023-07-09 MED ORDER — METRONIDAZOLE 500 MG PO TABS: 500.0000 mg | ORAL_TABLET | Freq: Two times a day (BID) | ORAL | 0 refills | Status: DC

## 2023-07-31 ENCOUNTER — Other Ambulatory Visit (INDEPENDENT_AMBULATORY_CARE_PROVIDER_SITE_OTHER): Payer: 59

## 2023-07-31 ENCOUNTER — Telehealth: Payer: Self-pay | Admitting: *Deleted

## 2023-07-31 DIAGNOSIS — Z1212 Encounter for screening for malignant neoplasm of rectum: Secondary | ICD-10-CM

## 2023-07-31 DIAGNOSIS — Z1211 Encounter for screening for malignant neoplasm of colon: Secondary | ICD-10-CM | POA: Diagnosis not present

## 2023-07-31 LAB — HEMOCCULT GUIAC POC 1CARD (OFFICE)
Card #2 Fecal Occult Blod, POC: NEGATIVE
Card #3 Fecal Occult Blood, POC: NEGATIVE
Fecal Occult Blood, POC: NEGATIVE

## 2023-07-31 NOTE — Telephone Encounter (Signed)
Left message @ 3:31 pm, letting pt know her hemocult cards were all negative. JSY

## 2023-08-08 LAB — COLOGUARD: COLOGUARD: NEGATIVE

## 2024-04-13 ENCOUNTER — Other Ambulatory Visit (HOSPITAL_COMMUNITY): Payer: Self-pay | Admitting: Adult Health

## 2024-04-13 DIAGNOSIS — Z1231 Encounter for screening mammogram for malignant neoplasm of breast: Secondary | ICD-10-CM

## 2024-04-21 ENCOUNTER — Encounter (HOSPITAL_COMMUNITY): Payer: Self-pay | Admitting: Radiology

## 2024-04-21 ENCOUNTER — Ambulatory Visit (HOSPITAL_COMMUNITY)
Admission: RE | Admit: 2024-04-21 | Discharge: 2024-04-21 | Disposition: A | Discharge status: Home / Self Care | Payer: Self-pay | Source: Ambulatory Visit | Attending: Adult Health | Admitting: Adult Health

## 2024-04-21 DIAGNOSIS — Z1231 Encounter for screening mammogram for malignant neoplasm of breast: Secondary | ICD-10-CM | POA: Insufficient documentation

## 2024-04-22 ENCOUNTER — Other Ambulatory Visit: Payer: Self-pay | Admitting: Medical Genetics

## 2024-04-27 ENCOUNTER — Ambulatory Visit: Payer: Self-pay | Admitting: Adult Health

## 2024-05-07 ENCOUNTER — Other Ambulatory Visit (HOSPITAL_COMMUNITY): Payer: Self-pay

## 2024-05-17 ENCOUNTER — Other Ambulatory Visit (HOSPITAL_COMMUNITY): Payer: Self-pay

## 2024-07-12 ENCOUNTER — Other Ambulatory Visit: Payer: Self-pay | Admitting: Adult Health

## 2024-07-12 MED ORDER — LO LOESTRIN FE 1 MG-10 MCG / 10 MCG PO TABS: 1.0000 | ORAL_TABLET | Freq: Every day | ORAL | Status: DC

## 2024-07-12 NOTE — Progress Notes (Signed)
 Gave 4 packs of lo loestrin to try to regulate periods and help with hot flashes

## 2024-08-01 ENCOUNTER — Other Ambulatory Visit: Payer: Self-pay | Admitting: Medical Genetics

## 2024-08-01 DIAGNOSIS — Z006 Encounter for examination for normal comparison and control in clinical research program: Secondary | ICD-10-CM

## 2024-09-01 LAB — GENECONNECT MOLECULAR SCREEN: Genetic Analysis Overall Interpretation: NEGATIVE

## 2024-10-06 ENCOUNTER — Ambulatory Visit (INDEPENDENT_AMBULATORY_CARE_PROVIDER_SITE_OTHER): Payer: Self-pay | Admitting: Adult Health

## 2024-10-06 ENCOUNTER — Encounter: Payer: Self-pay | Admitting: Adult Health

## 2024-10-06 VITALS — BP 101/66 | HR 106 | Ht 61.0 in | Wt 171.0 lb

## 2024-10-06 DIAGNOSIS — Z1329 Encounter for screening for other suspected endocrine disorder: Secondary | ICD-10-CM

## 2024-10-06 DIAGNOSIS — N926 Irregular menstruation, unspecified: Secondary | ICD-10-CM | POA: Diagnosis not present

## 2024-10-06 DIAGNOSIS — Z01419 Encounter for gynecological examination (general) (routine) without abnormal findings: Secondary | ICD-10-CM

## 2024-10-06 DIAGNOSIS — R61 Generalized hyperhidrosis: Secondary | ICD-10-CM | POA: Diagnosis not present

## 2024-10-06 DIAGNOSIS — N951 Menopausal and female climacteric states: Secondary | ICD-10-CM

## 2024-10-06 DIAGNOSIS — Z13220 Encounter for screening for lipoid disorders: Secondary | ICD-10-CM

## 2024-10-06 NOTE — Progress Notes (Signed)
 Patient ID: Mckenzie Andrews, female   DOB: 1976/08/05, 49 y.o.   MRN: [id number] History of Present Illness: Mckenzie Andrews is a 49 year old white female,single, G0P0, in for a well woman gyn exam. She had had bleeding for like 2 weeks in October and started lo Loestrin  but stopped after 2 months and no period since and is having hot flashes, and night sweats, that wakes her up.     Component Value Date/Time   DIAGPAP  07/02/2023 1633    - Negative for Intraepithelial Lesions or Malignancy (NILM)   DIAGPAP - Benign reactive/reparative changes 07/02/2023 1633   DIAGPAP  02/02/2020 1414    - Negative for intraepithelial lesion or malignancy (NILM)   HPVHIGH Negative 07/02/2023 1633   HPVHIGH Negative 02/02/2020 1414   ADEQPAP  07/02/2023 1633    Satisfactory for evaluation; transformation zone component PRESENT.   ADEQPAP  02/02/2020 1414    Satisfactory for evaluation; transformation zone component PRESENT.    PCP is Dr Sheryle   Current Medications, Allergies, Past Medical History, Past Surgical History, Family History and Social History were reviewed in [employer] electronic medical record.     Review of Systems: Patient denies any headaches, hearing loss, fatigue, blurred vision, shortness of breath, chest pain, abdominal pain, problems with bowel movements, urination, or intercourse.(Not active). No joint pain or mood swings.  See HPI for positives    Physical Exam:BP 101/66 (BP Location: Right Arm, Patient Position: Sitting, Cuff Size: Large)   Pulse (!) 106   Ht 5' 1 (1.549 m)   Wt 171 lb (77.6 kg)   BMI 32.31 kg/m   General:  Well developed, well nourished, no acute distress Skin:  Warm and dry Neck:  Midline trachea, normal thyroid, good ROM, no lymphadenopathy Lungs; Clear to auscultation bilaterally Breast:  No dominant palpable mass, retraction, or nipple discharge Cardiovascular: Regular rate and rhythm Abdomen:  Soft, non tender, no hepatosplenomegaly Pelvic:   External genitalia is normal in appearance, has few angiokeratomas on vulva.  The vagina is normal in appearance. Urethra has no lesions or masses. The cervix is smooth.  Uterus is felt to be normal size, shape, and contour.  No adnexal masses or tenderness noted.Bladder is non tender, no masses felt. Rectal:deferred Extremities/musculoskeletal:  No swelling or varicosities noted, no clubbing or cyanosis, had bruise on thigh from her dog Psych:  No mood changes, alert and cooperative,seems happy AA is 8, she is a wine drink, try to limit to 2 glasses a day, one even better  Fall risk is low    10/06/2024    2:44 PM 07/02/2023    3:43 PM 05/08/2022    2:27 PM  Depression screen PHQ 2/9  Decreased Interest 1 1 1   Down, Depressed, Hopeless 0 0 0  PHQ - 2 Score 1 1 1   Altered sleeping 1 1 0  Tired, decreased energy 1 1 1   Change in appetite 1 0 0  Feeling bad or failure about yourself  0 0 0  Trouble concentrating 0 0 0  Moving slowly or fidgety/restless 0 0 0  Suicidal thoughts 0 0 0  PHQ-9 Score 4 3  2       Data saved with a previous flowsheet row definition       10/06/2024    2:45 PM 07/02/2023    3:44 PM 05/08/2022    2:27 PM 02/02/2020    1:40 PM  GAD 7 : Generalized Anxiety Score  Nervous, Anxious, on Edge 1 1 2  0  Control/stop worrying 0 0 0 0  Worry too much - different things 0 0 0 0  Trouble relaxing 0 0 1 0  Restless 0 0 0 0  Easily annoyed or irritable 1 1 1 1   Afraid - awful might happen 0 0 0 0  Total GAD 7 Score 2 2 4 1   Anxiety Difficulty    Not difficult at all    Upstream - 10/06/24 1447       Pregnancy Intention Screening   Does the patient want to become pregnant in the next year? No    Does the patient's partner want to become pregnant in the next year? No    Would the patient like to discuss contraceptive options today? No      Contraception Wrap Up   Current Method Abstinence    End Method Abstinence    Contraception Counseling Provided No            Examination chaperoned by Clarita Salt LPN  Impression and plan: 1. Encounter for well woman exam with routine gynecological exam (Primary) Pap in 2027 Physical in 1 year Mammogram was negative 04/21/24 Cologuard was negative 07/31/23 Follow up with PCP Stay active  - CBC - Comprehensive metabolic panel with GFR - Lipid panel  2. Missed periods Has missed period, probably perimenopause  - TSH + free T4  3. Perimenopause  4. Night sweats  5. Screening cholesterol level - Lipid panel  6. Screening for thyroid disorder - TSH + free T4
